# Patient Record
Sex: Female | Born: 1965 | Race: Black or African American | Hispanic: No | Marital: Single | Smoking: Never smoker
Health system: Southern US, Community
[De-identification: ages and names within clinical notes are randomized; demographics above are authoritative.]

## PROBLEM LIST (undated history)

## (undated) DIAGNOSIS — E119 Type 2 diabetes mellitus without complications: Secondary | ICD-10-CM

## (undated) DIAGNOSIS — I10 Essential (primary) hypertension: Secondary | ICD-10-CM

---

## 1999-04-21 ENCOUNTER — Encounter: Payer: Self-pay | Admitting: Emergency Medicine

## 1999-04-21 ENCOUNTER — Emergency Department (HOSPITAL_COMMUNITY): Admission: EM | Admit: 1999-04-21 | Discharge: 1999-04-21 | Payer: Self-pay | Admitting: Emergency Medicine

## 2000-01-18 ENCOUNTER — Emergency Department (HOSPITAL_COMMUNITY): Admission: EM | Admit: 2000-01-18 | Discharge: 2000-01-18 | Payer: Self-pay

## 2000-03-25 ENCOUNTER — Emergency Department (HOSPITAL_COMMUNITY): Admission: EM | Admit: 2000-03-25 | Discharge: 2000-03-25 | Payer: Self-pay | Admitting: Emergency Medicine

## 2000-04-22 ENCOUNTER — Emergency Department (HOSPITAL_COMMUNITY): Admission: EM | Admit: 2000-04-22 | Discharge: 2000-04-22 | Payer: Self-pay | Admitting: Emergency Medicine

## 2000-05-15 ENCOUNTER — Other Ambulatory Visit: Admission: RE | Admit: 2000-05-15 | Discharge: 2000-05-15 | Payer: Self-pay | Admitting: Obstetrics

## 2000-05-15 ENCOUNTER — Encounter: Admission: RE | Admit: 2000-05-15 | Discharge: 2000-05-15 | Payer: Self-pay | Admitting: Obstetrics

## 2000-07-22 ENCOUNTER — Encounter: Admission: RE | Admit: 2000-07-22 | Discharge: 2000-07-22 | Payer: Self-pay | Admitting: Obstetrics & Gynecology

## 2000-10-16 ENCOUNTER — Emergency Department (HOSPITAL_COMMUNITY): Admission: EM | Admit: 2000-10-16 | Discharge: 2000-10-17 | Payer: Self-pay | Admitting: Emergency Medicine

## 2001-10-19 ENCOUNTER — Emergency Department (HOSPITAL_COMMUNITY): Admission: EM | Admit: 2001-10-19 | Discharge: 2001-10-19 | Payer: Self-pay | Admitting: Emergency Medicine

## 2003-04-14 ENCOUNTER — Other Ambulatory Visit: Admission: RE | Admit: 2003-04-14 | Discharge: 2003-04-14 | Payer: Self-pay | Admitting: Gynecology

## 2004-05-11 ENCOUNTER — Other Ambulatory Visit: Admission: RE | Admit: 2004-05-11 | Discharge: 2004-05-11 | Payer: Self-pay | Admitting: Gynecology

## 2017-06-24 ENCOUNTER — Other Ambulatory Visit: Payer: Self-pay | Admitting: Family Medicine

## 2017-06-24 DIAGNOSIS — Z1231 Encounter for screening mammogram for malignant neoplasm of breast: Secondary | ICD-10-CM

## 2017-07-03 ENCOUNTER — Ambulatory Visit
Admission: RE | Admit: 2017-07-03 | Discharge: 2017-07-03 | Disposition: A | Discharge status: Home / Self Care | Payer: Commercial Managed Care - PPO | Source: Ambulatory Visit | Attending: Family Medicine | Admitting: Family Medicine

## 2017-07-03 ENCOUNTER — Encounter: Payer: Self-pay | Admitting: Radiology

## 2017-07-03 DIAGNOSIS — Z1231 Encounter for screening mammogram for malignant neoplasm of breast: Secondary | ICD-10-CM

## 2019-09-22 ENCOUNTER — Other Ambulatory Visit: Payer: Self-pay

## 2019-09-22 ENCOUNTER — Emergency Department
Admission: EM | Admit: 2019-09-22 | Discharge: 2019-09-22 | Disposition: A | Discharge status: Home / Self Care | Payer: Commercial Managed Care - PPO | Source: Home / Self Care | Attending: Family Medicine | Admitting: Family Medicine

## 2019-09-22 DIAGNOSIS — J069 Acute upper respiratory infection, unspecified: Secondary | ICD-10-CM

## 2019-09-22 DIAGNOSIS — R509 Fever, unspecified: Secondary | ICD-10-CM | POA: Diagnosis not present

## 2019-09-22 HISTORY — DX: Essential (primary) hypertension: I10

## 2019-09-22 HISTORY — DX: Type 2 diabetes mellitus without complications: E11.9

## 2019-09-22 MED ORDER — BENZONATATE 200 MG PO CAPS: ORAL_CAPSULE | ORAL | 0 refills | Status: AC

## 2019-09-22 NOTE — ED Triage Notes (Signed)
Congestion, dizzy, body aches, eyes are burning, and nasal congestion since last Friday

## 2019-09-22 NOTE — ED Provider Notes (Signed)
[facility]    CSN: [id number] Arrival date & time: 09/22/19  1049      History   Chief Complaint Chief Complaint  Patient presents with  . Nasal Congestion  . Generalized Body Aches  . Dizziness    HPI Denise Flynn is a 53 y.o. female.   Patient developed a non-productive cough five days ago.  She has now developed sinus congestion, mild headache, tightness in her anterior chest, fatigue, and mild dizziness.  She notes that she has had no sense of taste/smell during the past two days.  The history is provided by the patient.    Past Medical History:  Diagnosis Date  . Diabetes mellitus without complication (HCC)   . Hypertension     There are no problems to display for this patient.   Past Surgical History:  Procedure Laterality Date  . ABDOMINAL HYSTERECTOMY      OB History   No obstetric history on file.      Home Medications    Prior to Admission medications   Medication Sig Start Date End Date Taking? Authorizing Provider  benzonatate (TESSALON) 200 MG capsule Take one cap by mouth at bedtime as needed for cough.  May repeat in 4 to 6 hours 09/22/19   Lattie Haw, MD  lisinopril-hydrochlorothiazide (ZESTORETIC) 10-12.5 MG tablet Take 1 tablet by mouth daily. 09/19/19   [provider]  metFORMIN (GLUCOPHAGE) 500 MG tablet Take 500 mg by mouth 2 (two) times daily. 04/24/19   [provider]    Family History Family History  Problem Relation Age of Onset  . Diabetes Father     Social History Social History   Tobacco Use  . Smoking status: Never Smoker  . Smokeless tobacco: Never Used  Substance Use Topics  . Alcohol use: Yes    Comment: socially  . Drug use: Not Currently     Allergies   Patient has no known allergies.   Review of Systems Review of Systems No sore throat + cough No pleuritic pain, but feels tight in anterior chest No wheezing + nasal congestion No post-nasal drainage No sinus  pain/pressure No itchy/red eyes No earache + mild dizziness No hemoptysis No SOB ? fever/chills No nausea No vomiting No abdominal pain No diarrhea No urinary symptoms No skin rash + fatigue + myalgias + headache   Physical Exam Triage Vital Signs ED Triage Vitals  Enc Vitals Group     BP 09/22/19 1124 120/79     Pulse Rate 09/22/19 1124 92     Resp 09/22/19 1124 20     Temp 09/22/19 1124 (!) 100.7 F (38.2 C)     Temp Source 09/22/19 1124 Oral     SpO2 09/22/19 1124 96 %     Weight 09/22/19 1120 216 lb (98 kg)     Height 09/22/19 1120 5\' 8"  (1.727 m)     Head Circumference --      Peak Flow --      Pain Score 09/22/19 1120 7     Pain Loc --      Pain Edu? --      Excl. in GC? --    No data found.  Updated Vital Signs BP 120/79 (BP Location: Left Arm)   Pulse 92   Temp (!) 100.7 F (38.2 C) (Oral)   Resp 20   Ht 5\' 8"  (1.727 m)   Wt 98 kg   SpO2 96%   BMI 32.84 kg/m  Visual Acuity Right Eye Distance:   Left Eye Distance:   Bilateral Distance:    Right Eye Near:   Left Eye Near:    Bilateral Near:     Physical Exam Nursing notes and Vital Signs reviewed. Appearance:  Patient appears stated age, and in no acute distress Eyes:  Pupils are equal, round, and reactive to light and accomodation.  Extraocular movement is intact.  Conjunctivae are not inflamed  Ears:  Canals normal.  Tympanic membranes normal.  Nose:  Mildly congested turbinates.  No sinus tenderness.  Pharynx:  Normal Neck:  Supple.  Mildly enlarged lateral nodes are present, tender to palpation on the left.   Lungs:  Clear to auscultation.  Breath sounds are equal.  Moving air well. Heart:  Regular rate and rhythm without murmurs, rubs, or gallops.  Abdomen:  Nontender without masses or hepatosplenomegaly.  Bowel sounds are present.  No CVA or flank tenderness.  Extremities:  No edema.  Skin:  No rash present.   UC Treatments / Results  Labs (all labs ordered are listed, but only  abnormal results are displayed) Labs Reviewed  NOVEL CORONAVIRUS, NAA    EKG   Radiology No results found.  Procedures Procedures (including critical care time)  Medications Ordered in UC Medications - No data to display  Initial Impression / Assessment and Plan / UC Course  I have reviewed the triage vital signs and the nursing notes.  Pertinent labs & imaging results that were available during my care of the patient were reviewed by me and considered in my medical decision making (see chart for details).    There is no evidence of bacterial infection today.  Treat symptomatically for now  COVID19 send out   Final Clinical Impressions(s) / UC Diagnoses   Final diagnoses:  Fever, unspecified  Viral URI with cough     Discharge Instructions     Take plain guaifenesin (1200mg  extended release tabs such as Mucinex) twice daily, with plenty of water, for cough and congestion.  Get adequate rest.   May use Afrin nasal spray (or generic oxymetazoline) each morning for about 5 days and then discontinue.  Also recommend using saline nasal spray several times daily and saline nasal irrigation (AYR is a common brand).  Use Flonase nasal spray each morning after using Afrin nasal spray and saline nasal irrigation. Try warm salt water gargles for sore throat.  Stop all antihistamines for now, and other non-prescription cough/cold preparations. May take Delsym Cough Suppressant with Tessalon at bedtime for nighttime cough.  May take Tylenol as needed for headache, fever, etc.    ED Prescriptions    Medication Sig Dispense Auth. Provider   benzonatate (TESSALON) 200 MG capsule Take one cap by mouth at bedtime as needed for cough.  May repeat in 4 to 6 hours 15 capsule Assunta Found Ishmael Holter, MD        Kandra Nicolas, MD 09/29/19 2223

## 2019-09-22 NOTE — Discharge Instructions (Addendum)
Take plain guaifenesin (1200mg  extended release tabs such as Mucinex) twice daily, with plenty of water, for cough and congestion.  Get adequate rest.   May use Afrin nasal spray (or generic oxymetazoline) each morning for about 5 days and then discontinue.  Also recommend using saline nasal spray several times daily and saline nasal irrigation (AYR is a common brand).  Use Flonase nasal spray each morning after using Afrin nasal spray and saline nasal irrigation. Try warm salt water gargles for sore throat.  Stop all antihistamines for now, and other non-prescription cough/cold preparations. May take Delsym Cough Suppressant with Tessalon at bedtime for nighttime cough.  May take Tylenol as needed for headache, fever, etc.

## 2019-09-24 LAB — NOVEL CORONAVIRUS, NAA: SARS-CoV-2, NAA: DETECTED — AB

## 2019-09-27 ENCOUNTER — Telehealth: Payer: Self-pay | Admitting: Emergency Medicine

## 2019-09-27 NOTE — Telephone Encounter (Signed)
Patient contacted by phone and made aware of  positive covid  results. Pt verbalized understanding and had all questions answered.    

## 2019-09-27 NOTE — Telephone Encounter (Signed)

## 2021-02-23 ENCOUNTER — Emergency Department (HOSPITAL_COMMUNITY)
Admission: EM | Admit: 2021-02-23 | Discharge: 2021-02-23 | Disposition: A | Discharge status: Home / Self Care | Payer: BC Managed Care – PPO | Attending: Emergency Medicine | Admitting: Emergency Medicine

## 2021-02-23 ENCOUNTER — Encounter (HOSPITAL_COMMUNITY): Payer: Self-pay

## 2021-02-23 ENCOUNTER — Other Ambulatory Visit: Payer: Self-pay

## 2021-02-23 DIAGNOSIS — R432 Parageusia: Secondary | ICD-10-CM | POA: Insufficient documentation

## 2021-02-23 DIAGNOSIS — Z79899 Other long term (current) drug therapy: Secondary | ICD-10-CM | POA: Insufficient documentation

## 2021-02-23 DIAGNOSIS — R739 Hyperglycemia, unspecified: Secondary | ICD-10-CM

## 2021-02-23 DIAGNOSIS — R3589 Other polyuria: Secondary | ICD-10-CM | POA: Insufficient documentation

## 2021-02-23 DIAGNOSIS — R350 Frequency of micturition: Secondary | ICD-10-CM | POA: Insufficient documentation

## 2021-02-23 DIAGNOSIS — Z7984 Long term (current) use of oral hypoglycemic drugs: Secondary | ICD-10-CM | POA: Insufficient documentation

## 2021-02-23 DIAGNOSIS — I10 Essential (primary) hypertension: Secondary | ICD-10-CM | POA: Insufficient documentation

## 2021-02-23 DIAGNOSIS — R5383 Other fatigue: Secondary | ICD-10-CM | POA: Diagnosis not present

## 2021-02-23 DIAGNOSIS — E1165 Type 2 diabetes mellitus with hyperglycemia: Secondary | ICD-10-CM | POA: Diagnosis not present

## 2021-02-23 LAB — CBC WITH DIFFERENTIAL/PLATELET
Abs Immature Granulocytes: 0.01 10*3/uL (ref 0.00–0.07)
Basophils Absolute: 0.1 10*3/uL (ref 0.0–0.1)
Basophils Relative: 1 %
Eosinophils Absolute: 0 10*3/uL (ref 0.0–0.5)
Eosinophils Relative: 1 %
HCT: 44.2 % (ref 36.0–46.0)
Hemoglobin: 14.9 g/dL (ref 12.0–15.0)
Immature Granulocytes: 0 %
Lymphocytes Relative: 37 %
Lymphs Abs: 1.8 10*3/uL (ref 0.7–4.0)
MCH: 28 pg (ref 26.0–34.0)
MCHC: 33.7 g/dL (ref 30.0–36.0)
MCV: 83.1 fL (ref 80.0–100.0)
Monocytes Absolute: 0.3 10*3/uL (ref 0.1–1.0)
Monocytes Relative: 5 %
Neutro Abs: 2.8 10*3/uL (ref 1.7–7.7)
Neutrophils Relative %: 56 %
Platelets: 280 10*3/uL (ref 150–400)
RBC: 5.32 MIL/uL — ABNORMAL HIGH (ref 3.87–5.11)
RDW: 14.2 % (ref 11.5–15.5)
WBC: 5 10*3/uL (ref 4.0–10.5)
nRBC: 0 % (ref 0.0–0.2)

## 2021-02-23 LAB — COMPREHENSIVE METABOLIC PANEL
ALT: 33 U/L (ref 0–44)
AST: 22 U/L (ref 15–41)
Albumin: 5.3 g/dL — ABNORMAL HIGH (ref 3.5–5.0)
Alkaline Phosphatase: 79 U/L (ref 38–126)
Anion gap: 13 (ref 5–15)
BUN: 20 mg/dL (ref 6–20)
CO2: 26 mmol/L (ref 22–32)
Calcium: 10.4 mg/dL — ABNORMAL HIGH (ref 8.9–10.3)
Chloride: 94 mmol/L — ABNORMAL LOW (ref 98–111)
Creatinine, Ser: 1.24 mg/dL — ABNORMAL HIGH (ref 0.44–1.00)
GFR, Estimated: 52 mL/min — ABNORMAL LOW (ref >60)
Glucose, Bld: 504 mg/dL (ref 70–99)
Potassium: 4.4 mmol/L (ref 3.5–5.1)
Sodium: 133 mmol/L — ABNORMAL LOW (ref 135–145)
Total Bilirubin: 0.9 mg/dL (ref 0.3–1.2)
Total Protein: 9.5 g/dL — ABNORMAL HIGH (ref 6.5–8.1)

## 2021-02-23 LAB — CBG MONITORING, ED
Glucose-Capillary: 508 mg/dL (ref 70–99)
Glucose-Capillary: 390 mg/dL — ABNORMAL HIGH (ref 70–99)
Glucose-Capillary: 329 mg/dL — ABNORMAL HIGH (ref 70–99)

## 2021-02-23 LAB — URINALYSIS, ROUTINE W REFLEX MICROSCOPIC
Bacteria, UA: NONE SEEN
Bilirubin Urine: NEGATIVE
Glucose, UA: >=500 mg/dL — AB
Hgb urine dipstick: NEGATIVE
Ketones, ur: 20 mg/dL — AB
Leukocytes,Ua: NEGATIVE
Nitrite: NEGATIVE
Protein, ur: 30 mg/dL — AB
Specific Gravity, Urine: 1.035 — ABNORMAL HIGH (ref 1.005–1.030)
pH: 5 (ref 5.0–8.0)

## 2021-02-23 MED ORDER — SODIUM CHLORIDE 0.9 % IV BOLUS
1000.0000 mL | Freq: Once | INTRAVENOUS | Status: AC
  Administered 2021-02-23: 1000 mL via INTRAVENOUS

## 2021-02-23 MED ORDER — SODIUM CHLORIDE 0.9 % IV BOLUS: 1000.0000 mL | Freq: Once | INTRAVENOUS | Status: DC

## 2021-02-23 MED ORDER — ONDANSETRON HCL 4 MG/2ML IJ SOLN
4.0000 mg | Freq: Once | INTRAMUSCULAR | Status: AC
  Administered 2021-02-23: 4 mg via INTRAVENOUS
  Filled 2021-02-23: qty 2

## 2021-02-23 MED ORDER — ACETAMINOPHEN 325 MG PO TABS
650.0000 mg | ORAL_TABLET | Freq: Once | ORAL | Status: AC
  Administered 2021-02-23: 650 mg via ORAL
  Filled 2021-02-23: qty 2

## 2021-02-23 MED ORDER — LACTATED RINGERS IV BOLUS
1000.0000 mL | Freq: Once | INTRAVENOUS | Status: AC
  Administered 2021-02-23: 1000 mL via INTRAVENOUS

## 2021-02-23 MED ORDER — LISINOPRIL 10 MG PO TABS
10.0000 mg | ORAL_TABLET | Freq: Once | ORAL | Status: AC
  Administered 2021-02-23: 10 mg via ORAL
  Filled 2021-02-23: qty 1

## 2021-02-23 MED ORDER — HYDROCHLOROTHIAZIDE 12.5 MG PO CAPS
12.5000 mg | ORAL_CAPSULE | Freq: Once | ORAL | Status: AC
  Administered 2021-02-23: 12.5 mg via ORAL
  Filled 2021-02-23: qty 1

## 2021-02-23 NOTE — ED Triage Notes (Signed)
Patient reports that she has had urinary frequency and a metallic taste when she brushes her teeth or eating peppermint.

## 2021-02-23 NOTE — ED Provider Notes (Signed)
[facility]-EMERGENCY DEPT Provider Note   CSN: [id number] Arrival date & time: 02/23/21  1507     History Chief Complaint  Patient presents with  . Urinary Frequency  . metallic taste  . Hyperglycemia    Denise Flynn is a 55 y.o. female with past medical history of hypertension, type 2 diabetes, presenting for evaluation.  She states over the last few days she has been feeling very thirsty with associated polyuria.  She has also had fatigue.  She is having some mild nausea without vomiting.  No abdominal pain, diarrhea, constipation, dysuria, urgency.  No URI symptoms.  States she is taking her metformin as directed though about 1 year ago they decreased her dose.  She now takes 500 mg twice daily.  She is compliant with her medications and does not miss any doses.  She did not take her blood pressure medicine today.  The history is provided by the patient.       Past Medical History:  Diagnosis Date  . Diabetes mellitus without complication (HCC)   . Hypertension     There are no problems to display for this patient.   Past Surgical History:  Procedure Laterality Date  . ABDOMINAL HYSTERECTOMY       OB History   No obstetric history on file.     Family History  Problem Relation Age of Onset  . Diabetes Father   . Hypertension Mother     Social History   Tobacco Use  . Smoking status: Never Smoker  . Smokeless tobacco: Never Used  Vaping Use  . Vaping Use: Never used  Substance Use Topics  . Alcohol use: Yes    Comment: socially  . Drug use: Not Currently    Home Medications Prior to Admission medications   Medication Sig Start Date End Date Taking? Authorizing Provider  EQ ASPIRIN ADULT LOW DOSE 81 MG EC tablet Take 81 mg by mouth daily. 12/01/20  Yes [provider]  lisinopril-hydrochlorothiazide (ZESTORETIC) 10-12.5 MG tablet Take 1 tablet by mouth daily. 09/19/19  Yes [provider]  metFORMIN (GLUCOPHAGE)  500 MG tablet Take 250 mg by mouth 2 (two) times daily. 04/24/19  Yes [provider]  benzonatate (TESSALON) 200 MG capsule Take one cap by mouth at bedtime as needed for cough.  May repeat in 4 to 6 hours Patient not taking: No sig reported 09/22/19   Lattie Haw, MD    Allergies    Patient has no known allergies.  Review of Systems   Review of Systems  Constitutional: Positive for fatigue.  Endocrine: Positive for polydipsia and polyuria.  All other systems reviewed and are negative.   Physical Exam Updated Vital Signs BP 111/77   Pulse 77   Temp 98.1 F (36.7 C) (Oral)   Resp 17   Ht 5\' 8"  (1.727 m)   Wt 100.7 kg   SpO2 95%   BMI 33.75 kg/m   Physical Exam Vitals and nursing note reviewed.  Constitutional:      General: She is not in acute distress.    Appearance: She is well-developed. She is not ill-appearing.  HENT:     Head: Normocephalic and atraumatic.  Eyes:     Conjunctiva/sclera: Conjunctivae normal.  Cardiovascular:     Rate and Rhythm: Normal rate and regular rhythm.     Pulses: Normal pulses.  Pulmonary:     Effort: Pulmonary effort is normal. No respiratory distress.     Breath sounds:  Normal breath sounds.  Abdominal:     General: Bowel sounds are normal.     Palpations: Abdomen is soft.     Tenderness: There is no abdominal tenderness. There is no guarding or rebound.  Skin:    General: Skin is warm.  Neurological:     Mental Status: She is alert.  Psychiatric:        Behavior: Behavior normal.     ED Results / Procedures / Treatments   Labs (all labs ordered are listed, but only abnormal results are displayed) Labs Reviewed  URINALYSIS, ROUTINE W REFLEX MICROSCOPIC - Abnormal; Notable for the following components:      Result Value   Color, Urine STRAW (*)    Specific Gravity, Urine 1.035 (*)    Glucose, UA >=500 (*)    Ketones, ur 20 (*)    Protein, ur 30 (*)    All other components within normal limits   COMPREHENSIVE METABOLIC PANEL - Abnormal; Notable for the following components:   Sodium 133 (*)    Chloride 94 (*)    Glucose, Bld 504 (*)    Creatinine, Ser 1.24 (*)    Calcium 10.4 (*)    Total Protein 9.5 (*)    Albumin 5.3 (*)    GFR, Estimated 52 (*)    All other components within normal limits  CBC WITH DIFFERENTIAL/PLATELET - Abnormal; Notable for the following components:   RBC 5.32 (*)    All other components within normal limits  CBG MONITORING, ED - Abnormal; Notable for the following components:   Glucose-Capillary 508 (*)    All other components within normal limits  CBG MONITORING, ED - Abnormal; Notable for the following components:   Glucose-Capillary 390 (*)    All other components within normal limits  CBG MONITORING, ED - Abnormal; Notable for the following components:   Glucose-Capillary 329 (*)    All other components within normal limits  CBG MONITORING, ED    EKG EKG Interpretation  Date/Time:  Friday Feb 23 2021 17:11:29 EDT Ventricular Rate:  91 PR Interval:  124 QRS Duration: 77 QT Interval:  345 QTC Calculation: 425 R Axis:   40 Text Interpretation: Sinus rhythm Consider right atrial enlargement No previous tracing Confirmed by Gwyneth Sprout ([id number]) on 02/23/2021 5:20:27 PM   Radiology No results found.  Procedures Procedures   Medications Ordered in ED Medications  sodium chloride 0.9 % bolus 1,000 mL (0 mLs Intravenous Stopped 02/23/21 2345)  ondansetron (ZOFRAN) injection 4 mg (4 mg Intravenous Given 02/23/21 1812)  lisinopril (ZESTRIL) tablet 10 mg (10 mg Oral Given 02/23/21 1831)  hydrochlorothiazide (MICROZIDE) capsule 12.5 mg (12.5 mg Oral Given 02/23/21 1831)  lactated ringers bolus 1,000 mL (0 mLs Intravenous Stopped 02/23/21 2345)  acetaminophen (TYLENOL) tablet 650 mg (650 mg Oral Given 02/23/21 2029)    ED Course  I have reviewed the triage vital signs and the nursing notes.  Pertinent labs & imaging results that were  available during my care of the patient were reviewed by me and considered in my medical decision making (see chart for details).    MDM Rules/Calculators/A&P                          Patient is a type II diabetic compliant on metformin, presenting for polyuria, polydipsia and fatigue over the last few days.  She is found to be hyperglycemic to 500, though not in DKA with normal gap and bicarb.  Corrected Na for hyperglycemia is 139.  She is overall well-appearing in no distress.  Afebrile.  UA without signs of infection.  She hypertensive though did not take her blood pressure medication today, improved with home dose.   BG downtrending with IVF. She reports improvement in symptoms. Recommend patient begin monitoring CBG daily and follow closely with PCP. States she has appt in 2 weeks. Return if sx worsen or if CBG too high to glucometer to read.  Final Clinical Impression(s) / ED Diagnoses Final diagnoses:  Hyperglycemia    Rx / DC Orders ED Discharge Orders    None       Smitty Ackerley, [place] N, PA-C 02/23/21 2348    Gwyneth Sprout, MD 02/26/21 [id number]

## 2021-02-23 NOTE — Discharge Instructions (Addendum)
Please monitor your blood sugars while awaiting your PCP appointment. It is important you hydrate well.  Return to the ED for worsening symptoms.

## 2021-02-23 NOTE — ED Provider Notes (Signed)
Emergency Medicine Provider Triage Evaluation Note  Denise Flynn , a 55 y.o. female  was evaluated in triage.  Pt complains of fatigue, polydipsia, polyuria. Denies chest pain, sob, abd pain, dysuria, urgency, nvd, cough, or fevers. Has hx dm, takes metformin and has been compliant  Review of Systems  Positive: Fatigue, polyuria, polydipsia, dysuria, urgency Negative: chest pain, sob, abd pain, dysuria, urgency, nvd, cough, or fevers  Physical Exam  BP (!) 152/108 (BP Location: Left Arm)   Pulse (!) 101   Temp 98.1 F (36.7 C) (Oral)   Resp 16   Ht 5\' 8"  (1.727 m)   Wt 100.7 kg   SpO2 100%   BMI 33.75 kg/m  Gen:   Awake, no distress   Resp:  Normal effort  MSK:   Moves extremities without difficulty  Other:  Tachycardia, abd soft and nontender  Medical Decision Making  Medically screening exam initiated at 4:06 PM.  Appropriate orders placed.  Denise Flynn was informed that the remainder of the evaluation will be completed by another provider, this initial triage assessment does not replace that evaluation, and the importance of remaining in the ED until their evaluation is complete.    Marylin Crosby 02/23/21 1607    02/25/21, MD 02/23/21 2113

## 2021-02-23 NOTE — ED Notes (Signed)
RN notified of abnormal lab 

## 2021-02-25 ENCOUNTER — Emergency Department (HOSPITAL_COMMUNITY)
Admission: EM | Admit: 2021-02-25 | Discharge: 2021-02-25 | Disposition: A | Discharge status: Home / Self Care | Payer: BC Managed Care – PPO | Attending: Emergency Medicine | Admitting: Emergency Medicine

## 2021-02-25 ENCOUNTER — Encounter (HOSPITAL_COMMUNITY): Payer: Self-pay

## 2021-02-25 ENCOUNTER — Other Ambulatory Visit: Payer: Self-pay

## 2021-02-25 ENCOUNTER — Emergency Department (HOSPITAL_COMMUNITY): Payer: BC Managed Care – PPO

## 2021-02-25 DIAGNOSIS — B373 Candidiasis of vulva and vagina: Secondary | ICD-10-CM | POA: Diagnosis not present

## 2021-02-25 DIAGNOSIS — R739 Hyperglycemia, unspecified: Secondary | ICD-10-CM

## 2021-02-25 DIAGNOSIS — Z79899 Other long term (current) drug therapy: Secondary | ICD-10-CM | POA: Insufficient documentation

## 2021-02-25 DIAGNOSIS — B3731 Acute candidiasis of vulva and vagina: Secondary | ICD-10-CM

## 2021-02-25 DIAGNOSIS — E1165 Type 2 diabetes mellitus with hyperglycemia: Secondary | ICD-10-CM | POA: Diagnosis not present

## 2021-02-25 DIAGNOSIS — Z7984 Long term (current) use of oral hypoglycemic drugs: Secondary | ICD-10-CM | POA: Diagnosis not present

## 2021-02-25 DIAGNOSIS — H538 Other visual disturbances: Secondary | ICD-10-CM | POA: Diagnosis present

## 2021-02-25 DIAGNOSIS — L299 Pruritus, unspecified: Secondary | ICD-10-CM | POA: Insufficient documentation

## 2021-02-25 LAB — COMPREHENSIVE METABOLIC PANEL
ALT: 37 U/L (ref 0–44)
AST: 41 U/L (ref 15–41)
Albumin: 4.7 g/dL (ref 3.5–5.0)
Alkaline Phosphatase: 60 U/L (ref 38–126)
Anion gap: 13 (ref 5–15)
BUN: 20 mg/dL (ref 6–20)
CO2: 22 mmol/L (ref 22–32)
Calcium: 9.5 mg/dL (ref 8.9–10.3)
Chloride: 96 mmol/L — ABNORMAL LOW (ref 98–111)
Creatinine, Ser: 1.14 mg/dL — ABNORMAL HIGH (ref 0.44–1.00)
GFR, Estimated: 58 mL/min — ABNORMAL LOW (ref >60)
Glucose, Bld: 342 mg/dL — ABNORMAL HIGH (ref 70–99)
Potassium: 3.9 mmol/L (ref 3.5–5.1)
Sodium: 131 mmol/L — ABNORMAL LOW (ref 135–145)
Total Bilirubin: 0.7 mg/dL (ref 0.3–1.2)
Total Protein: 8 g/dL (ref 6.5–8.1)

## 2021-02-25 LAB — URINALYSIS, ROUTINE W REFLEX MICROSCOPIC
Bacteria, UA: NONE SEEN
Bilirubin Urine: NEGATIVE
Glucose, UA: >=500 mg/dL — AB
Hgb urine dipstick: NEGATIVE
Ketones, ur: 80 mg/dL — AB
Nitrite: NEGATIVE
Protein, ur: 30 mg/dL — AB
Specific Gravity, Urine: 1.029 (ref 1.005–1.030)
pH: 5 (ref 5.0–8.0)

## 2021-02-25 LAB — CBC
HCT: 41 % (ref 36.0–46.0)
Hemoglobin: 13.8 g/dL (ref 12.0–15.0)
MCH: 28.2 pg (ref 26.0–34.0)
MCHC: 33.7 g/dL (ref 30.0–36.0)
MCV: 83.7 fL (ref 80.0–100.0)
Platelets: 204 10*3/uL (ref 150–400)
RBC: 4.9 MIL/uL (ref 3.87–5.11)
RDW: 14.3 % (ref 11.5–15.5)
WBC: 3.9 10*3/uL — ABNORMAL LOW (ref 4.0–10.5)
nRBC: 0 % (ref 0.0–0.2)

## 2021-02-25 LAB — CBG MONITORING, ED
Glucose-Capillary: 330 mg/dL — ABNORMAL HIGH (ref 70–99)
Glucose-Capillary: 349 mg/dL — ABNORMAL HIGH (ref 70–99)
Glucose-Capillary: 245 mg/dL — ABNORMAL HIGH (ref 70–99)

## 2021-02-25 MED ORDER — SODIUM CHLORIDE 0.9 % IV BOLUS
1000.0000 mL | Freq: Once | INTRAVENOUS | Status: AC
  Administered 2021-02-25: 1000 mL via INTRAVENOUS

## 2021-02-25 MED ORDER — FLUCONAZOLE 150 MG PO TABS: 150.0000 mg | ORAL_TABLET | ORAL | 0 refills | Status: AC

## 2021-02-25 MED ORDER — METFORMIN HCL 1000 MG PO TABS: 1000.0000 mg | ORAL_TABLET | Freq: Every day | ORAL | 0 refills | Status: AC

## 2021-02-25 NOTE — Discharge Instructions (Addendum)
For your yeast infection, take a Diflucan pill.  In 3 days, if your symptoms do not completely resolve, take the second pill.  For your elevated blood sugars, increase your metformin dosing.  Take 1000 mg in the morning, 500 at night.  Make sure you keep your appointment with your primary care doctor for further evaluation management of your diabetes.  For your blurry vision, it is important that you follow-up with an eye doctor.  You may follow-up with your own optometrist, or with the ophthalmologist listed below.  Return to the ER if you develop eye pain, double vision, or vision loss.

## 2021-02-25 NOTE — ED Notes (Addendum)
CBG- 349mg /dl

## 2021-02-25 NOTE — ED Triage Notes (Signed)
Patient states she did not take her CBG at home today. Patient states her blood sugar was 480's yesterday. Patient only takes Metformin. CBG in triage- 330.  Patient also c/o itching and blurred vision.

## 2021-02-25 NOTE — ED Provider Notes (Signed)
[facility]-EMERGENCY DEPT Provider Note   CSN: [id number] Arrival date & time: 02/25/21  1034     History Chief Complaint  Patient presents with  . Hyperglycemia  . Pruritis  . Blurred Vision    Denise Flynn is a 55 y.o. female presenting for evaluation of elevated blood sugars, vaginal irritation/itching, and blurry vision.  Patient states she has had issues with hypoglycemia recently.  She seen in the ED a few days ago, felt much better on discharge.  However, once she returned home, she noticed that she was having some blurry vision.  This is affecting both eyes.  Has been persistent since.  She denies headache or vision loss.  She has tried using her reading glasses without improvement of symptoms.  She has not seen an eye doctor recently.  She also reports vaginal irritation/itching, no vaginal discharge.  No dysuria or hematuria.  Urinary frequency has mostly resolved.  She denies fevers, chills, headache, chest pain, shortness breath, cough, nausea, vomit, abdominal pain, abnormal bowel movements.  She continues to take 500 mg of metformin twice a day, has not had her morning dose today yet.  She do not take her blood pressure medicine today.  Additional history obtained from chart review.  History of diabetes and hypertension.  I reviewed ED visit from 2 days ago.   HPI     Past Medical History:  Diagnosis Date  . Diabetes mellitus without complication (HCC)   . Hypertension     There are no problems to display for this patient.   Past Surgical History:  Procedure Laterality Date  . ABDOMINAL HYSTERECTOMY       OB History   No obstetric history on file.     Family History  Problem Relation Age of Onset  . Diabetes Father   . Hypertension Mother     Social History   Tobacco Use  . Smoking status: Never Smoker  . Smokeless tobacco: Never Used  Vaping Use  . Vaping Use: Never used  Substance Use Topics  . Alcohol use: Yes     Comment: socially  . Drug use: Not Currently    Home Medications Prior to Admission medications   Medication Sig Start Date End Date Taking? Authorizing Provider  fluconazole (DIFLUCAN) 150 MG tablet Take 1 tablet (150 mg total) by mouth every 3 (three) days for 2 doses. 02/25/21 03/01/21 Yes Binta Statzer, PA-C  metFORMIN (GLUCOPHAGE) 1000 MG tablet Take 1 tablet (1,000 mg total) by mouth daily with breakfast. 02/25/21  Yes Eustacio Ellen, PA-C  benzonatate (TESSALON) 200 MG capsule Take one cap by mouth at bedtime as needed for cough.  May repeat in 4 to 6 hours Patient not taking: No sig reported 09/22/19   Lattie Haw, MD  EQ ASPIRIN ADULT LOW DOSE 81 MG EC tablet Take 81 mg by mouth daily. 12/01/20   [provider]  lisinopril-hydrochlorothiazide (ZESTORETIC) 10-12.5 MG tablet Take 1 tablet by mouth daily. 09/19/19   [provider]  metFORMIN (GLUCOPHAGE) 500 MG tablet Take 250 mg by mouth 2 (two) times daily. 04/24/19   [provider]    Allergies    Patient has no known allergies.  Review of Systems   Review of Systems  Eyes: Positive for visual disturbance.  Genitourinary:       Vaginal irritation  All other systems reviewed and are negative.   Physical Exam Updated Vital Signs BP 127/77   Pulse 65   Temp 98 F (  36.7 C) (Oral)   Resp 18   Ht 5\' 8"  (1.727 m)   Wt 100.7 kg   SpO2 100%   BMI 33.76 kg/m   Physical Exam Vitals and nursing note reviewed. Exam conducted with a chaperone present.  Constitutional:      General: She is not in acute distress.    Appearance: She is well-developed. She is obese.  HENT:     Head: Normocephalic and atraumatic.  Eyes:     Extraocular Movements: Extraocular movements intact.     Conjunctiva/sclera: Conjunctivae normal.     Pupils: Pupils are equal, round, and reactive to light.     Comments: EOMI. Visual fields intact. No change in vision with covering one eye. Fundoscopic exam without  obvious hemorrhage.   Cardiovascular:     Rate and Rhythm: Normal rate and regular rhythm.     Pulses: Normal pulses.  Pulmonary:     Effort: Pulmonary effort is normal. No respiratory distress.     Breath sounds: Normal breath sounds. No wheezing.  Abdominal:     General: There is no distension.     Palpations: Abdomen is soft. There is no mass.     Tenderness: There is no abdominal tenderness. There is no guarding or rebound.  Genitourinary:    Comments: White rash noted on labia c/w yeast infection. External exam only performed Musculoskeletal:        General: Normal range of motion.     Cervical back: Normal range of motion and neck supple.  Skin:    General: Skin is warm and dry.  Neurological:     Mental Status: She is alert and oriented to person, place, and time.     ED Results / Procedures / Treatments   Labs (all labs ordered are listed, but only abnormal results are displayed) Labs Reviewed  CBC - Abnormal; Notable for the following components:      Result Value   WBC 3.9 (*)    All other components within normal limits  URINALYSIS, ROUTINE W REFLEX MICROSCOPIC - Abnormal; Notable for the following components:   APPearance HAZY (*)    Glucose, UA >=500 (*)    Ketones, ur 80 (*)    Protein, ur 30 (*)    Leukocytes,Ua SMALL (*)    All other components within normal limits  COMPREHENSIVE METABOLIC PANEL - Abnormal; Notable for the following components:   Sodium 131 (*)    Chloride 96 (*)    Glucose, Bld 342 (*)    Creatinine, Ser 1.14 (*)    GFR, Estimated 58 (*)    All other components within normal limits  CBG MONITORING, ED - Abnormal; Notable for the following components:   Glucose-Capillary 330 (*)    All other components within normal limits  CBG MONITORING, ED - Abnormal; Notable for the following components:   Glucose-Capillary 349 (*)    All other components within normal limits  CBG MONITORING, ED - Abnormal; Notable for the following components:    Glucose-Capillary 245 (*)    All other components within normal limits    EKG None  Radiology CT Head Wo Contrast  Result Date: 02/25/2021 CLINICAL DATA:  Headache with double vision.  Elevated blood sugar. EXAM: CT HEAD WITHOUT CONTRAST TECHNIQUE: Contiguous axial images were obtained from the base of the skull through the vertex without intravenous contrast. COMPARISON:  None. FINDINGS: Brain: No evidence of acute infarction, hemorrhage, hydrocephalus, extra-axial collection or mass lesion/mass effect. Vascular: No hyperdense vessel or  unexpected calcification. Skull: Normal. Negative for fracture or focal lesion. Sinuses/Orbits: Globes and orbits are unremarkable. Visualized sinuses are clear. Other: None. IMPRESSION: Normal unenhanced CT scan of the brain. Electronically Signed   By: Amie Portland M.D.   On: 02/25/2021 12:16    Procedures Procedures   Medications Ordered in ED Medications  sodium chloride 0.9 % bolus 1,000 mL (0 mLs Intravenous Stopped 02/25/21 1314)    ED Course  I have reviewed the triage vital signs and the nursing notes.  Pertinent labs & imaging results that were available during my care of the patient were reviewed by me and considered in my medical decision making (see chart for details).    MDM Rules/Calculators/A&P                          Patient resenting for evaluation of blurry vision, blurred symptoms, and vaginal irritation.  On exam, patient peers nontoxic.  External genitalia exam consistent with yeast infection, will treat with Diflucan.  Regarding hyperglycemia, will obtain labs to ensure no evidence of DKA or concerning electrolyte abnormalities.  Regarding blurry vision, will order visual acuity, obtain CT head.  Funduscopic exam is overall reassuring.  Labs interpreted by me, shows mild hyperglycemia, but no evidence of DKA.  There are ketones in the urine, however without anion gap or low bicarb, I do not believe patient needs admission or  IV insulin.  However I did discuss that in the setting of continued hyperglycemia, patient may need to increase her metformin dosing.  I discussed this with patient, who is agreeable.  CT head was negative for acute findings.  Visual acuity equal bilaterally, 20/25 on each eye.  Discussed importance of close follow-up with optometry/ophthalmology for further evaluation of the retinas.  However I do not believe she needs acute ophthalmologic evaluation today in the ED.  At this time, patient appears safe for discharge.  Return precautions given.  Patient states she understands and agrees to plan.   Final Clinical Impression(s) / ED Diagnoses Final diagnoses:  Hyperglycemia  Blurred vision  Yeast vaginitis    Rx / DC Orders ED Discharge Orders         Ordered    fluconazole (DIFLUCAN) 150 MG tablet  Every 3 DAYS        02/25/21 1331    metFORMIN (GLUCOPHAGE) 1000 MG tablet  Daily with breakfast        02/25/21 1331           Burnett Spray, PA-C 02/25/21 1456    Rolan Bucco, MD 02/25/21 1459

## 2021-03-15 ENCOUNTER — Other Ambulatory Visit: Payer: Self-pay | Admitting: Obstetrics and Gynecology

## 2021-03-15 DIAGNOSIS — R928 Other abnormal and inconclusive findings on diagnostic imaging of breast: Secondary | ICD-10-CM

## 2021-04-05 ENCOUNTER — Ambulatory Visit
Admission: RE | Admit: 2021-04-05 | Discharge: 2021-04-05 | Disposition: A | Discharge status: Home / Self Care | Payer: BC Managed Care – PPO | Source: Ambulatory Visit | Attending: Obstetrics and Gynecology | Admitting: Obstetrics and Gynecology

## 2021-04-05 ENCOUNTER — Other Ambulatory Visit: Payer: Self-pay

## 2021-04-05 DIAGNOSIS — R928 Other abnormal and inconclusive findings on diagnostic imaging of breast: Secondary | ICD-10-CM

## 2022-08-02 IMAGING — US US BREAST*R* LIMITED INC AXILLA
1 series · 8 of 8 positions shown · non-contrast
Comparison: Previous exams including recent screening mammogram
dated 03/12/2021.

CLINICAL DATA: Patient returns today to evaluate a possible RIGHT
breast asymmetry questioned on recent screening mammogram.

EXAM:
DIGITAL DIAGNOSTIC UNILATERAL RIGHT MAMMOGRAM WITH TOMOSYNTHESIS AND
CAD; ULTRASOUND RIGHT BREAST LIMITED
TECHNIQUE: Right digital diagnostic mammography and breast tomosynthesis was
performed. The images were evaluated with computer-aided detection.;
Targeted ultrasound examination of the right breast was performed

[Series 1: us breast*right* limited inc axilla · 0.06mm/px · 8 of 8 slices shown]
[im 1/8]
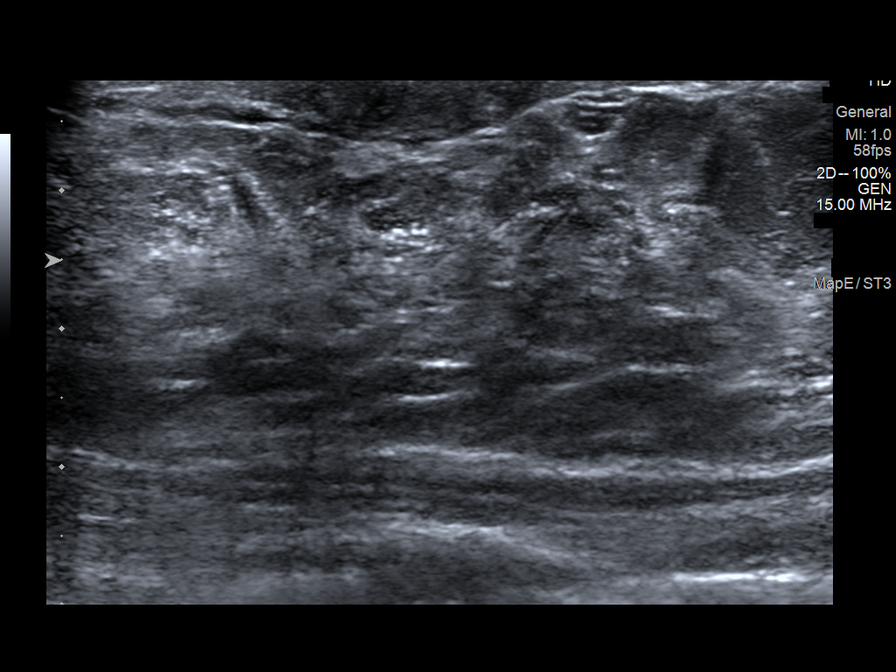
[im 2/8]
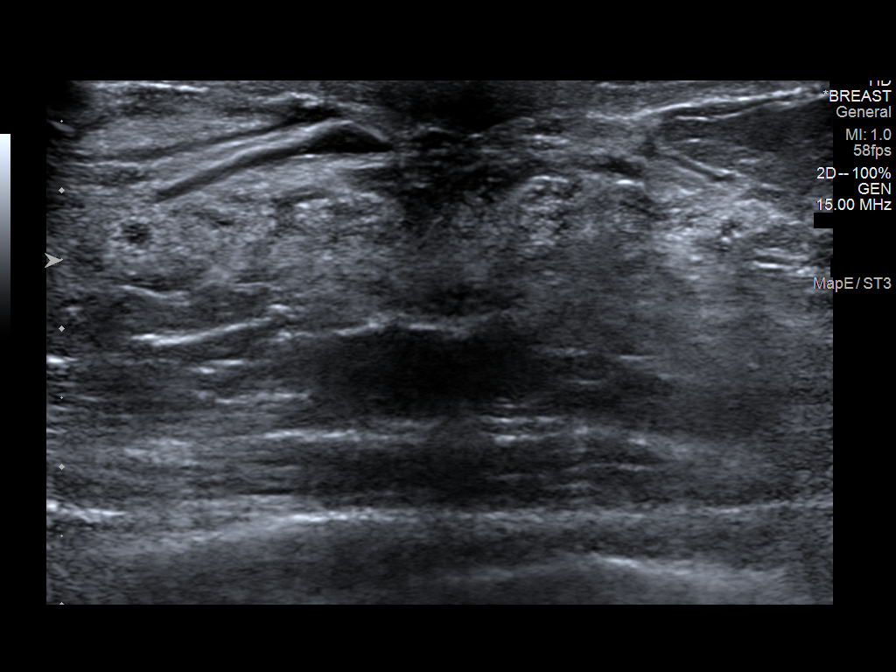
[im 3/8]
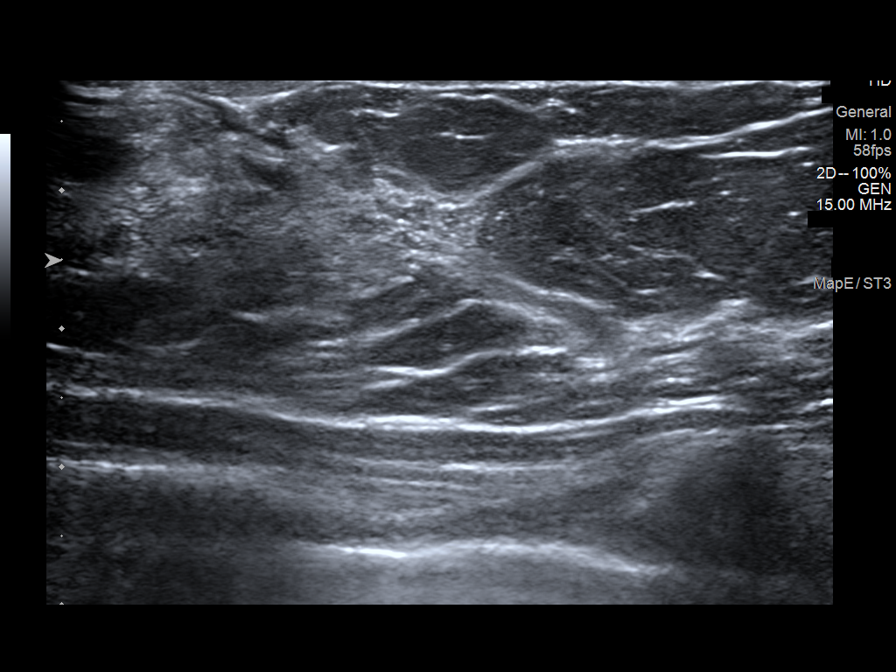
[im 4/8]
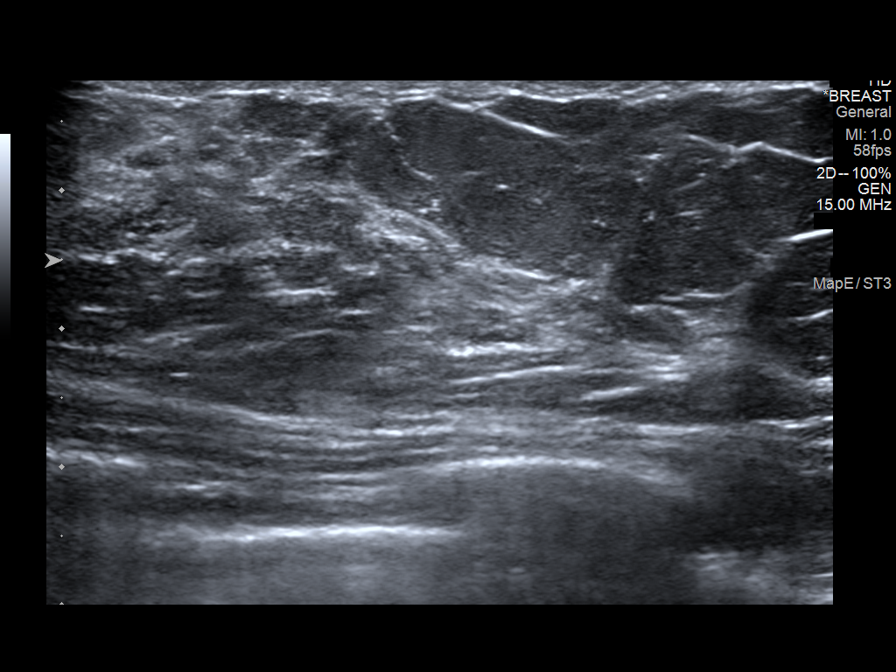
[im 5/8]
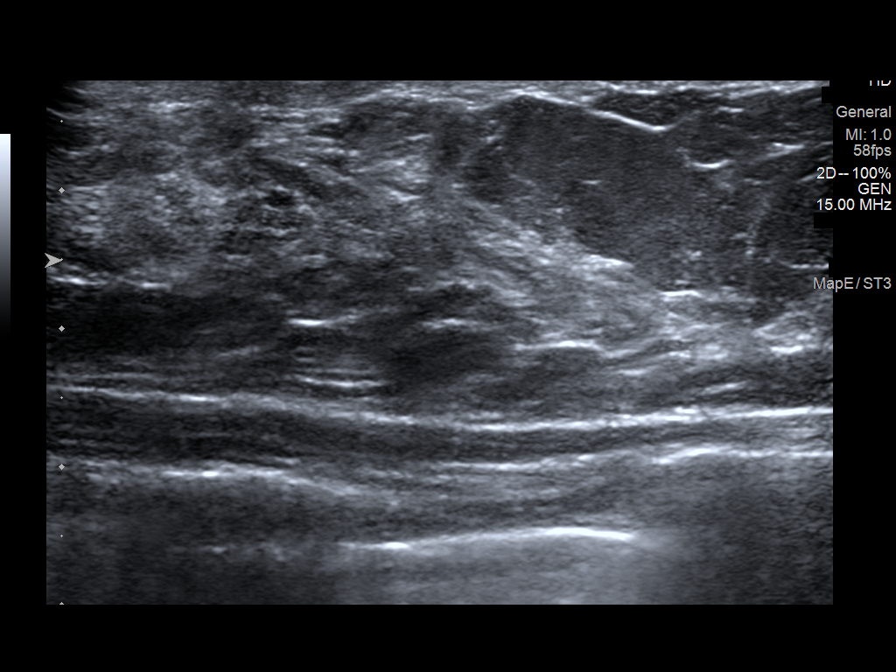
[im 6/8]
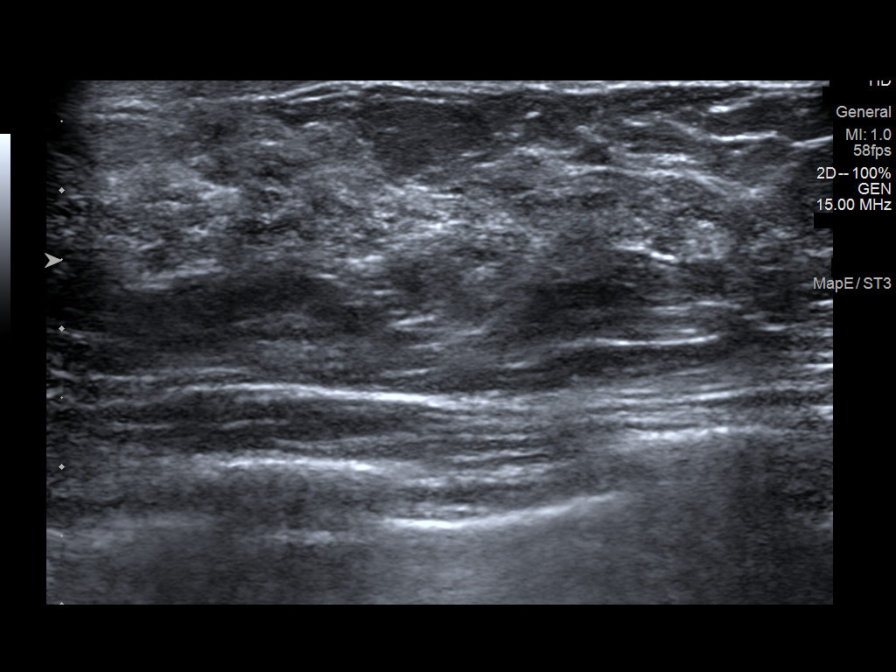
[im 7/8]
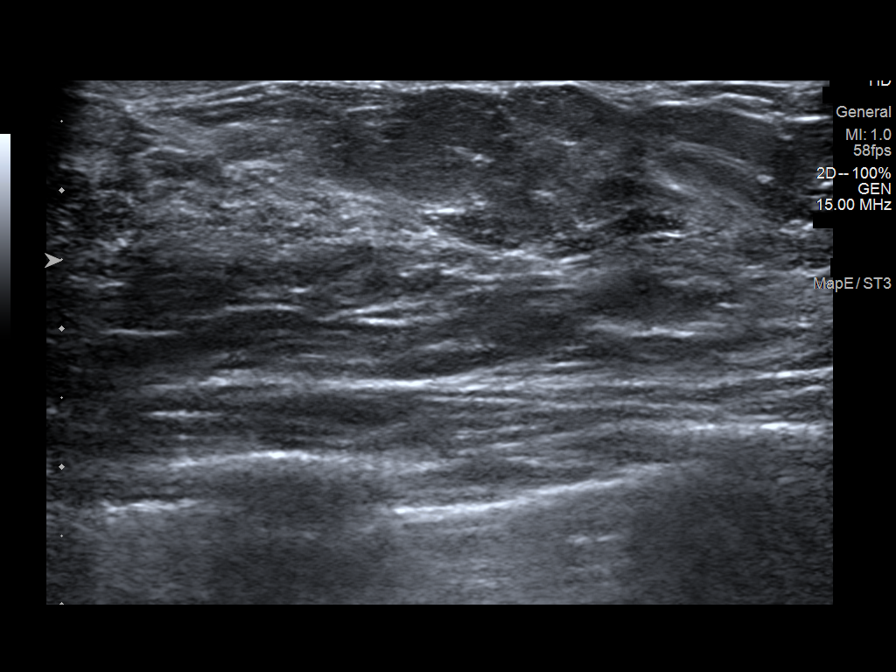
[im 8/8]
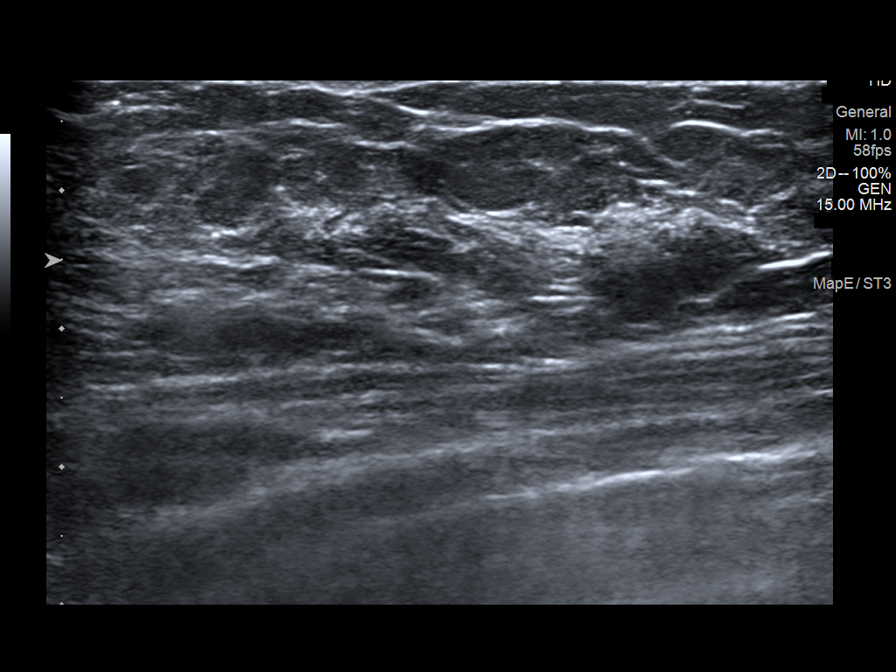

[8 of 8 positions shown; findings below may reference images not displayed]

ACR Breast Density Category c: The breast tissue is heterogeneously
dense, which may obscure small masses.
FINDINGS: RIGHT breast diagnostic mammogram: There is a persistent asymmetry
within the periareolar RIGHT breast, with a tubular configuration,
located in the lower outer quadrant based on tomosynthesis slice
position, suspected mild benign duct ectasia.

Targeted ultrasound is performed, showing several mildly prominent
milk ducts within the subareolar and periareolar RIGHT breast, a
likely correlate for the mammographic finding, compatible with mild
benign duct ectasia. Additionally, there is a small
chronic-appearing sebaceous cyst within the RIGHT breast at the 8
o'clock axis, 2 cm from the nipple, additional possible correlate
for the mammographic finding.

No suspicious solid or cystic mass is seen by ultrasound within the
subareolar or periareolar RIGHT breast.
IMPRESSION: No evidence of malignancy.  Benign findings described above.

Patient may return to routine annual bilateral screening mammogram
schedule.

RECOMMENDATION:
Screening mammogram in one year.(Code:[id number])

I have discussed the findings and recommendations with the patient.
If applicable, a reminder letter will be sent to the patient
regarding the next appointment.

BI-RADS CATEGORY  2: Benign.
